# Patient Record
Sex: Male | Born: 1970 | Race: White | Hispanic: No | Marital: Married | Smoking: Never smoker
Health system: Southern US, Community
[De-identification: ages and names within clinical notes are randomized; demographics above are authoritative.]

## PROBLEM LIST (undated history)

## (undated) DIAGNOSIS — I493 Ventricular premature depolarization: Secondary | ICD-10-CM

## (undated) DIAGNOSIS — K760 Fatty (change of) liver, not elsewhere classified: Secondary | ICD-10-CM

## (undated) DIAGNOSIS — N200 Calculus of kidney: Secondary | ICD-10-CM

## (undated) HISTORY — DX: Calculus of kidney: N20.0

## (undated) HISTORY — DX: Fatty (change of) liver, not elsewhere classified: K76.0

## (undated) HISTORY — DX: Ventricular premature depolarization: I49.3

---

## 2002-12-24 HISTORY — PX: HERNIA REPAIR: SHX51

## 2005-12-24 DIAGNOSIS — K760 Fatty (change of) liver, not elsewhere classified: Secondary | ICD-10-CM

## 2005-12-24 HISTORY — DX: Fatty (change of) liver, not elsewhere classified: K76.0

## 2006-12-24 DIAGNOSIS — N200 Calculus of kidney: Secondary | ICD-10-CM

## 2006-12-24 HISTORY — DX: Calculus of kidney: N20.0

## 2011-06-06 ENCOUNTER — Emergency Department (HOSPITAL_COMMUNITY)
Admission: EM | Admit: 2011-06-06 | Discharge: 2011-06-07 | Disposition: A | Discharge status: Home / Self Care | Payer: Medicaid Other | Attending: Emergency Medicine | Admitting: Emergency Medicine

## 2011-06-06 ENCOUNTER — Emergency Department (HOSPITAL_COMMUNITY): Payer: Medicaid Other

## 2011-06-06 DIAGNOSIS — R071 Chest pain on breathing: Secondary | ICD-10-CM | POA: Insufficient documentation

## 2011-06-07 LAB — POCT I-STAT, CHEM 8
Calcium, Ion: 1.19 mmol/L (ref 1.12–1.32)
Creatinine, Ser: 0.9 mg/dL (ref 0.4–1.5)
Glucose, Bld: 131 mg/dL — ABNORMAL HIGH (ref 70–99)
HCT: 44 % (ref 39.0–52.0)
Hemoglobin: 15 g/dL (ref 13.0–17.0)

## 2011-12-25 HISTORY — PX: COLONOSCOPY: SHX174

## 2012-03-11 ENCOUNTER — Ambulatory Visit: Payer: Self-pay | Admitting: Urology

## 2012-05-16 ENCOUNTER — Ambulatory Visit (INDEPENDENT_AMBULATORY_CARE_PROVIDER_SITE_OTHER): Payer: Medicaid Other | Admitting: Family Medicine

## 2012-05-16 VITALS — BP 105/74 | HR 73 | Temp 97.0°F | Ht 68.0 in | Wt 153.0 lb

## 2012-05-16 DIAGNOSIS — K760 Fatty (change of) liver, not elsewhere classified: Secondary | ICD-10-CM

## 2012-05-16 DIAGNOSIS — M549 Dorsalgia, unspecified: Secondary | ICD-10-CM

## 2012-05-16 DIAGNOSIS — L708 Other acne: Secondary | ICD-10-CM

## 2012-05-16 DIAGNOSIS — L709 Acne, unspecified: Secondary | ICD-10-CM

## 2012-05-16 DIAGNOSIS — E8881 Metabolic syndrome: Secondary | ICD-10-CM

## 2012-05-16 DIAGNOSIS — K7689 Other specified diseases of liver: Secondary | ICD-10-CM

## 2012-05-16 NOTE — Patient Instructions (Signed)
It was great to see you today! I will look into any monitoring that we need to do for your fatty liver and get in touch on that.

## 2012-06-02 ENCOUNTER — Encounter: Payer: Self-pay | Admitting: Family Medicine

## 2012-06-02 DIAGNOSIS — E8881 Metabolic syndrome: Secondary | ICD-10-CM | POA: Insufficient documentation

## 2012-06-02 DIAGNOSIS — L709 Acne, unspecified: Secondary | ICD-10-CM | POA: Insufficient documentation

## 2012-06-02 DIAGNOSIS — M549 Dorsalgia, unspecified: Secondary | ICD-10-CM | POA: Insufficient documentation

## 2012-06-02 DIAGNOSIS — K760 Fatty (change of) liver, not elsewhere classified: Secondary | ICD-10-CM | POA: Insufficient documentation

## 2012-06-02 NOTE — Assessment & Plan Note (Signed)
Will send referral as requested. 

## 2012-06-02 NOTE — Assessment & Plan Note (Signed)
Will send referral as requested.  No abnormalities on exam today.

## 2012-06-02 NOTE — Assessment & Plan Note (Signed)
Will send referral as requested.

## 2012-06-02 NOTE — Progress Notes (Signed)
Patient ID: Robert Moon, male   DOB: [birth year], 41 y.o.   MRN: [id number] Subjective: The patient is a 41 y.o. year old male who presents today for new patient appointment.  Patient reports minimal medical problems.  He sees Dr. Josefina Do, chiropractor, for various problems with back pain, Dr. Armida Sans, dermatology, for acne and viral warts, and Dr. Becky Augusta, functional medicine, for various endocrine disturbances (adrenal fatigue, hyperlipidemia, insulin resistance, functional hypothyroid).  He is currently on no medications on a daily basis.  He reports that fatty liver infiltrate was discovered on abdominal CT about 6 years ago.  No obvious etiology was found.  He does not drink significantly, in not overweight, and does not use illicit substances.  As far as he knows, he has not had exposure to any form of hepatitis.  He has occasionally had mildly elevated liver enzymes but otherwise labs have been normal.  Review of the patient's outside labs shows cholesterol levels are within normal limits, CBC, CMET, TSH, A1c are all within normal limits.  Lab results will be scanned to patient chart.  Patient does report some mild acne for which he take adapalene on a PRN basis.  Otherwise denies any significant weight changes, headaches, visual changes, n/v/d, SOB, CP, DOE, leg swelling, rashes, abdominal pain or swelling.  Patient's past medical, social, and family history were reviewed and updated as appropriate. History  Substance Use Topics  . Smoking status: Never Smoker   . Smokeless tobacco: Never Used  . Alcohol Use: No   Objective:  Filed Vitals:   05/16/12 1457  BP: 105/74  Pulse: 73  Temp: 97 F (36.1 C)   Gen: NAD, appropriate weight HEENT: MMM, EOMI, TM clear bilaterally, pharynx non-erythematous CV: RRR, no murmurs Resp: CTABL Abd: SNTND, no hepatomegaly Ext: No edema, 2+ pulses  Assessment/Plan:  Please also see individual problems in problem list for  problem-specific plans.

## 2012-06-02 NOTE — Assessment & Plan Note (Signed)
No obvious source.  Currently asymptomatic. Patient is doing everything that he can to correct this condition.  Review of labs today don't show significant abnormalities.  Will continue surveillance and will give patient list of labs he needs to make certain he has had.  Patient can let me know if he needs any of them and we can draw.

## 2012-07-08 ENCOUNTER — Telehealth: Payer: Self-pay | Admitting: *Deleted

## 2012-07-08 NOTE — Telephone Encounter (Signed)
NPI # provided to authorize requested visits.  Gaylene Brooks, RN

## 2012-07-08 NOTE — Telephone Encounter (Signed)
Returned call to [place] and left message to call our office back.  Gaylene Brooks, RN

## 2012-07-08 NOTE — Telephone Encounter (Signed)
They have had it.  I sent in the referral shortly after seeing him for his new patient appointment on 5/24.  But if they need the NPI again, they can have it.

## 2012-07-08 NOTE — Telephone Encounter (Signed)
Sandy at [facility] calling to obtain NPI # to authorize acne treatment.  Patient had an appt on 06/19/12 and has upcoming appt on 07/17/12.  Patient's medical records were faxed to our office for Dr. Louanne Belton to review in 05/2012.  They have not heard back from our office with NPI # authorizing treatments.  They are requesting a retroactive authorization for 06/19/12 and additional follow-up appts.  Will route note to Dr. Louanne Belton and call their office back.  Gaylene Brooks, RN

## 2012-07-17 ENCOUNTER — Other Ambulatory Visit: Payer: Self-pay | Admitting: Family Medicine

## 2012-07-17 DIAGNOSIS — K760 Fatty (change of) liver, not elsewhere classified: Secondary | ICD-10-CM

## 2012-07-22 ENCOUNTER — Ambulatory Visit: Payer: Medicaid Other | Admitting: Family Medicine

## 2012-08-06 ENCOUNTER — Other Ambulatory Visit: Payer: Self-pay | Admitting: Family Medicine

## 2012-08-06 DIAGNOSIS — K760 Fatty (change of) liver, not elsewhere classified: Secondary | ICD-10-CM

## 2012-08-06 DIAGNOSIS — IMO0002 Reserved for concepts with insufficient information to code with codable children: Secondary | ICD-10-CM

## 2012-08-06 DIAGNOSIS — E8881 Metabolic syndrome: Secondary | ICD-10-CM

## 2012-08-18 ENCOUNTER — Ambulatory Visit (INDEPENDENT_AMBULATORY_CARE_PROVIDER_SITE_OTHER): Payer: Medicaid Other | Admitting: Cardiovascular Disease

## 2012-08-18 ENCOUNTER — Encounter: Payer: Self-pay | Admitting: Cardiovascular Disease

## 2012-08-18 VITALS — BP 120/80 | HR 83 | Ht 68.0 in | Wt 152.5 lb

## 2012-08-18 DIAGNOSIS — R0600 Dyspnea, unspecified: Secondary | ICD-10-CM

## 2012-08-18 DIAGNOSIS — R0989 Other specified symptoms and signs involving the circulatory and respiratory systems: Secondary | ICD-10-CM

## 2012-08-18 DIAGNOSIS — Z01810 Encounter for preprocedural cardiovascular examination: Secondary | ICD-10-CM

## 2012-08-18 NOTE — Patient Instructions (Addendum)
Your physician has requested that you have an exercise tolerance test. For further information please visit [email or web address]. Please also follow instruction sheet, as given.  Follow up as needed.  

## 2012-08-19 ENCOUNTER — Encounter: Payer: Self-pay | Admitting: Cardiovascular Disease

## 2012-08-19 DIAGNOSIS — R0600 Dyspnea, unspecified: Secondary | ICD-10-CM | POA: Insufficient documentation

## 2012-08-19 NOTE — Assessment & Plan Note (Addendum)
The patient wants to start an exercise program. Overall he is asymptomatic except for mild exertional dyspnea. His physical exam is within normal limits and baseline ECG is normal. I will obtain a treadmill stress test for evaluation but there does not appear to be any contraindication for starting an exercise program. Given that his cardiac exam is unremarkable, I will not obtain an echocardiogram at this time and has his GXT is abnormal.

## 2012-08-19 NOTE — Progress Notes (Signed)
HPI  This is a 41 year old male who is here today for evaluation of exercise capacity before starting an exercise program. He reports mild exertional dyspnea without chest pain. There is no orthopnea, PND or lower extremity edema. He is not aware of any previous cardiac history and has no risk factors for coronary artery disease. He was diagnosed with fatty liver years ago. For that reason he wants to start an exercise program. He is not obese.  No Known Allergies   Current Outpatient Prescriptions on File Prior to Visit  Medication Sig Dispense Refill  . Adapalene 0.3 % gel Apply topically at bedtime.         Past Medical History  Diagnosis Date  . Renal stone 2008  . Fatty infiltration of liver 2007    No obvious etiology     Past Surgical History  Procedure Date  . Hernia repair 2004    inguinal  . Colonoscopy 2013     Family History  Problem Relation Age of Onset  . Alcohol abuse Paternal Grandfather   . Prostate cancer Paternal Grandfather 74  . Heart failure Paternal Grandfather 89  . Diabetes type II Paternal Grandfather   . Allergies Maternal Grandmother   . Atrial fibrillation Maternal Grandmother 85  . Hypertension Maternal Grandmother   . Stroke Maternal Grandmother 48  . Liver cancer Mother 36    Primary hepatoma  . Depression Mother   . Adrenal disorder Sister 73    adrenocortical carcinoma  . Heart failure Maternal Grandfather 86  . Diabetes type II Paternal Grandmother   . Hypertension Paternal Grandmother   . Kidney failure Paternal Grandmother   . ALS Other 35    maternal great grandmother     History   Social History  . Marital Status: Married    Spouse Name: N/A    Number of Children: N/A  . Years of Education: N/A   Occupational History  . Not on file.   Social History Main Topics  . Smoking status: Never Smoker   . Smokeless tobacco: Never Used  . Alcohol Use: No  . Drug Use: No  . Sexually Active: Yes -- Male partner(s)    Other Topics Concern  . Not on file   Social History Narrative   Lives with wife, two sons and two daughters.  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     ROS Constitutional: Negative for fever, chills, diaphoresis, activity change, appetite change and fatigue.  HENT: Negative for hearing loss, nosebleeds, congestion, sore throat, facial swelling, drooling, trouble swallowing, neck pain, voice change, sinus pressure and tinnitus.  Eyes: Negative for photophobia, pain, discharge and visual disturbance.  Respiratory: Negative for apnea, cough, chest tightness and wheezing.  Cardiovascular: Negative for chest pain, palpitations and leg swelling.  Gastrointestinal: Negative for nausea, vomiting, abdominal pain, diarrhea, constipation, blood in stool and abdominal distention.  Genitourinary: Negative for dysuria, urgency, frequency, hematuria and decreased urine volume.  Musculoskeletal: Negative for myalgias, back pain, joint swelling, arthralgias and gait problem.  Skin: Negative for color change, pallor, rash and wound.  Neurological: Negative for dizziness, tremors, seizures, syncope, speech difficulty, weakness, light-headedness, numbness and headaches.  Psychiatric/Behavioral: Negative for suicidal ideas, hallucinations, behavioral problems and agitation. The patient is not nervous/anxious.     PHYSICAL EXAM   BP 120/80  Pulse 83  Ht 5\' 8"  (1.727 m)  Wt 152 lb 8 oz (69.174 kg)  BMI 23.19 kg/m2 Constitutional: He is oriented to person, place, and time.  He appears well-developed and well-nourished. No distress.  HENT: No nasal discharge.  Head: Normocephalic and atraumatic.  Eyes: Pupils are equal and round. Right eye exhibits no discharge. Left eye exhibits no discharge.  Neck: Normal range of motion. Neck supple. No JVD present. No thyromegaly present.  Cardiovascular: Normal rate, regular rhythm, normal heart sounds and. Exam reveals no gallop and no friction rub. No  murmur heard.  Pulmonary/Chest: Effort normal and breath sounds normal. No stridor. No respiratory distress. He has no wheezes. He has no rales. He exhibits no tenderness.  Abdominal: Soft. Bowel sounds are normal. He exhibits no distension. There is no tenderness. There is no rebound and no guarding.  Musculoskeletal: Normal range of motion. He exhibits no edema and no tenderness.  Neurological: He is alert and oriented to person, place, and time. Coordination normal.  Skin: Skin is warm and dry. No rash noted. He is not diaphoretic. No erythema. No pallor.  Psychiatric: He has a normal mood and affect. His behavior is normal. Judgment and thought content normal.       EKG: Sinus  Rhythm  WITHIN NORMAL LIMITS   ASSESSMENT AND PLAN

## 2012-08-21 ENCOUNTER — Encounter: Payer: Self-pay | Admitting: *Deleted

## 2012-08-26 ENCOUNTER — Ambulatory Visit (INDEPENDENT_AMBULATORY_CARE_PROVIDER_SITE_OTHER): Payer: Medicaid Other | Admitting: Cardiovascular Disease

## 2012-08-26 ENCOUNTER — Encounter: Payer: Self-pay | Admitting: Cardiovascular Disease

## 2012-08-26 VITALS — BP 108/68 | HR 107 | Ht 68.0 in | Wt 154.0 lb

## 2012-08-26 DIAGNOSIS — R0600 Dyspnea, unspecified: Secondary | ICD-10-CM

## 2012-08-26 DIAGNOSIS — R0609 Other forms of dyspnea: Secondary | ICD-10-CM

## 2012-08-26 NOTE — Procedures (Signed)
    Treadmill Stress test  Indication: Dyspnea and exercise tolerance estimation.  Baseline Data:  Resting EKG shows NSR with rate of 107 bpm, no significant ST changes. Resting blood pressure of 108/68 mm Hg Stand bruce protocal was used.  Exercise Data:  Patient exercised for 12 min 0 sec,  Peak heart rate of 167 bpm.  This was 92 % of the maximum predicted heart rate. No symptoms of chest pain or lightheadedness were reported at peak stress or in recovery.  Peak Blood pressure recorded was 154/72 Maximal work level: 12.9 METs.  Heart rate at 3 minutes in recovery was 115 bpm. BP response: Normal HR response: Normal  EKG with Exercise: Sinus tachycardia with no significant ST changes  FINAL IMPRESSION: Normal exercise stress test. No significant EKG changes concerning for ischemia. Good exercise tolerance.  Recommendation: Indirect estimation of peak VO2 is 42.46 which is considered good for age.

## 2012-08-26 NOTE — Patient Instructions (Addendum)
Your stress test is normal.  Exercise capacity is good 13 METS.

## 2012-08-27 ENCOUNTER — Telehealth: Payer: Self-pay

## 2012-08-27 NOTE — Telephone Encounter (Signed)
Pt informed Understanding verb 

## 2012-08-27 NOTE — Telephone Encounter (Signed)
Message copied by Marcelle Overlie on Wed Aug 27, 2012  7:41 AM ------      Message from: Lorine Bears A      Created: Tue Aug 26, 2012  6:48 PM      Regarding: Stress test       Please inform him that his estimated peak VO2 was 42.46 which is considered good for his age.

## 2012-08-27 NOTE — Telephone Encounter (Signed)
No answer

## 2012-09-17 ENCOUNTER — Telehealth: Payer: Self-pay | Admitting: Family Medicine

## 2012-09-17 DIAGNOSIS — K760 Fatty (change of) liver, not elsewhere classified: Secondary | ICD-10-CM

## 2012-09-17 NOTE — Telephone Encounter (Signed)
Patient is calling to speak to Dr. Louanne Belton about Robert Moon and Glucose Tolerance Test.  His Insurance will cover it, procedure codes are 47829 and [phone].  Tests just need to be ordered.

## 2012-09-17 NOTE — Telephone Encounter (Signed)
Forwarded to pcp.Sole Lengacher Lynetta  

## 2012-09-18 NOTE — Telephone Encounter (Signed)
Please schedule, thanks!  Robert Moon

## 2012-09-19 ENCOUNTER — Telehealth: Payer: Self-pay | Admitting: *Deleted

## 2012-09-19 NOTE — Telephone Encounter (Signed)
Telephone  Call to pt to make appt for lab

## 2012-09-22 ENCOUNTER — Other Ambulatory Visit: Payer: Medicaid Other

## 2012-09-22 DIAGNOSIS — K760 Fatty (change of) liver, not elsewhere classified: Secondary | ICD-10-CM

## 2012-09-22 LAB — IRON AND TIBC
%SAT: 27 % (ref 20–55)
Iron: 95 ug/dL (ref 42–165)
UIBC: 260 ug/dL (ref 125–400)

## 2012-09-22 NOTE — Progress Notes (Unsigned)
LABS DRAWN TODAY PER DR. [facility] Ceceilia Cephus

## 2012-09-23 LAB — HEPATITIS PANEL, ACUTE
Hep B C IgM: NEGATIVE
Hepatitis B Surface Ag: NEGATIVE

## 2012-09-23 LAB — GLUCOSE TOLERANCE, 3 HOURS
Glucose Tolerance, 1 hour: 122 mg/dL (ref 70–189)
Glucose Tolerance, 2 hour: 102 mg/dL (ref 70–164)
Glucose Tolerance, Fasting: 93 mg/dL (ref 70–104)

## 2012-09-25 LAB — ANTI-MICROSOMAL ANTIBODY LIVER / KIDNEY: LKM1 Ab: <=20 U (ref <=20.0)

## 2012-10-03 ENCOUNTER — Encounter: Payer: Self-pay | Admitting: Family Medicine

## 2012-10-16 ENCOUNTER — Telehealth: Payer: Self-pay | Admitting: Family Medicine

## 2012-10-16 DIAGNOSIS — K760 Fatty (change of) liver, not elsewhere classified: Secondary | ICD-10-CM

## 2012-10-16 DIAGNOSIS — E88819 Insulin resistance, unspecified: Secondary | ICD-10-CM

## 2012-10-16 DIAGNOSIS — E8881 Metabolic syndrome: Secondary | ICD-10-CM

## 2012-10-16 NOTE — Telephone Encounter (Signed)
Patient is calling about the blood work that he dropped off copies of this morning and he is concerned about his CO2 being elevated for 4 months.  He is requesting additional blood work to test his Aldosterone ratio and Renin, which he will need orders for.  Also, he is concerned that his Blood Pressure is running low.

## 2012-10-21 NOTE — Telephone Encounter (Signed)
Patient is calling back to find out the status of the orders for the labs he requested.

## 2012-10-27 NOTE — Telephone Encounter (Signed)
Talked with patient.  Explained that further testing is not in order given the elevation is minimal and accompanied with no other derangements.  Have agreed to monitor serial BMETs every 3-4 months.

## 2012-10-31 NOTE — Telephone Encounter (Signed)
Pt called back to say he went to [facility] this week and the following are the readings from that: Potassium - 5.3 CO2 - 30 Urine PH - 6.5

## 2012-10-31 NOTE — Telephone Encounter (Signed)
Please let the patient know that I would like the lab repeated in our lab in about 1 month.

## 2012-11-04 ENCOUNTER — Telehealth: Payer: Self-pay | Admitting: Family Medicine

## 2012-11-04 NOTE — Telephone Encounter (Signed)
Will forward to Dr Ritch 

## 2012-11-04 NOTE — Telephone Encounter (Signed)
Patient would like to speak to Dr. Louanne Belton about his labs from early November about his Potassium level.

## 2012-11-04 NOTE — Telephone Encounter (Signed)
Called patient and got no answer just ringing

## 2012-11-06 NOTE — Telephone Encounter (Signed)
Reporting that most recent labs had K of 4 and CO2 of 28.

## 2012-11-26 ENCOUNTER — Other Ambulatory Visit: Payer: Medicaid Other

## 2012-11-26 DIAGNOSIS — K760 Fatty (change of) liver, not elsewhere classified: Secondary | ICD-10-CM

## 2012-11-26 LAB — BASIC METABOLIC PANEL
CO2: 29 mEq/L (ref 19–32)
Calcium: 9.6 mg/dL (ref 8.4–10.5)
Glucose, Bld: 87 mg/dL (ref 70–99)
Potassium: 4.5 mEq/L (ref 3.5–5.3)
Sodium: 142 mEq/L (ref 135–145)

## 2012-11-26 NOTE — Progress Notes (Signed)
BMP DONE TODAY Shavana Calder 

## 2012-12-01 ENCOUNTER — Encounter: Payer: Self-pay | Admitting: Family Medicine

## 2012-12-11 ENCOUNTER — Telehealth: Payer: Self-pay | Admitting: Family Medicine

## 2012-12-11 NOTE — Telephone Encounter (Signed)
Pt is asking about his referrals - has some questions

## 2012-12-12 ENCOUNTER — Telehealth: Payer: Self-pay | Admitting: Family Medicine

## 2012-12-12 NOTE — Telephone Encounter (Signed)
Pt is asking what he needs to do to sign up for the MyChart

## 2012-12-12 NOTE — Telephone Encounter (Signed)
Dermatology called for authorization for [name], but the referral had expired.  Then [name] called to request a new referral and inquired about other referrals that he had for himself and his entire family.  He was told the start and expirations dates and the number of visits that were authorized and that for further referrals he would need to make an appt to see Dr. Louanne Belton because he has only been seen one time and that was in May 2013.  He did not wish to schedule an appt at that time.  Then he called back yesterday asking for the same information and a message was forwarded to Dr. Louanne Belton.  He has now called this morning asking for the very same and again has not scheduled an appt.  After his initial call, I spoke to Dr. Louanne Belton to make him aware of this situation.

## 2012-12-15 ENCOUNTER — Encounter: Payer: Self-pay | Admitting: Family Medicine

## 2013-01-01 ENCOUNTER — Telehealth: Payer: Self-pay | Admitting: Family Medicine

## 2013-01-01 NOTE — Telephone Encounter (Signed)
Pt dropped off papers for Dr. Louanne Belton to make an referral. TW

## 2013-01-02 ENCOUNTER — Ambulatory Visit (INDEPENDENT_AMBULATORY_CARE_PROVIDER_SITE_OTHER): Payer: Medicaid Other | Admitting: Family Medicine

## 2013-01-02 ENCOUNTER — Encounter: Payer: Self-pay | Admitting: Family Medicine

## 2013-01-02 VITALS — BP 111/75 | HR 87 | Ht 68.0 in | Wt 155.5 lb

## 2013-01-02 DIAGNOSIS — H9319 Tinnitus, unspecified ear: Secondary | ICD-10-CM

## 2013-01-02 DIAGNOSIS — H9312 Tinnitus, left ear: Secondary | ICD-10-CM

## 2013-01-02 DIAGNOSIS — Z872 Personal history of diseases of the skin and subcutaneous tissue: Secondary | ICD-10-CM

## 2013-01-02 DIAGNOSIS — Z86018 Personal history of other benign neoplasm: Secondary | ICD-10-CM

## 2013-01-02 DIAGNOSIS — K7689 Other specified diseases of liver: Secondary | ICD-10-CM

## 2013-01-02 DIAGNOSIS — K760 Fatty (change of) liver, not elsewhere classified: Secondary | ICD-10-CM

## 2013-01-02 DIAGNOSIS — E8881 Metabolic syndrome: Secondary | ICD-10-CM

## 2013-01-02 DIAGNOSIS — B079 Viral wart, unspecified: Secondary | ICD-10-CM

## 2013-01-05 DIAGNOSIS — H9312 Tinnitus, left ear: Secondary | ICD-10-CM | POA: Insufficient documentation

## 2013-01-05 DIAGNOSIS — Z86018 Personal history of other benign neoplasm: Secondary | ICD-10-CM | POA: Insufficient documentation

## 2013-01-05 NOTE — Assessment & Plan Note (Signed)
Present for 4 months with no obvious precipitating factor.  If present for total of 6 months referral to ENT would be appropriate.  Will continue watchful waiting for now.

## 2013-01-05 NOTE — Progress Notes (Signed)
Patient ID: Robert Moon, male   DOB: Sep 19, 1971, 42 y.o.   MRN: [id number] Subjective: The patient is a 42 y.o. year old male who presents today for discussion of referrals.  1. Dermatology: patient has had dysplastic nevus removed and goes to dermatology for yearly total body exams.  Has also had some problems with viral warts on his hands that have been frozen off.  2. Functional medicine: Patient sees Dr. Aggie Moats for dietary changes and monitoring of his fatty liver disease and a diagnosis she has given him of insulin resistance.  3. Chiropractor: Sees chiropractor every 1-2 months for back adjustments.  4. ENT: Patient has been having some problems with tinitus in left ear for about the last 4 months.  No dizziness/loss of balance.  No nasal congestion.  Has not had a problem with this before.  Would like to see ENT for this.  Patient's past medical, social, and family history were reviewed and updated as appropriate. History  Substance Use Topics  . Smoking status: Never Smoker   . Smokeless tobacco: Never Used  . Alcohol Use: No   Objective:  Filed Vitals:   01/02/13 0948  BP: 111/75  Pulse: 87   Hand: Small (3mm) bump on left hand between thumb and second digit.  Procedure note: Consent obtained and risks/benefits discussed.  Area was frozen using 3 freeze-thaw cycles of 20-30sec each.  Pt tolerated well.  Assessment/Plan: Chiropractor: Referral not appropriate or allowed by medicaid.  Please also see individual problems in problem list for problem-specific plans.

## 2013-01-05 NOTE — Assessment & Plan Note (Signed)
Referral place per patient request

## 2013-01-28 ENCOUNTER — Encounter: Payer: Self-pay | Admitting: Family Medicine

## 2013-01-29 ENCOUNTER — Other Ambulatory Visit: Payer: Self-pay | Admitting: Family Medicine

## 2013-01-29 ENCOUNTER — Telehealth: Payer: Self-pay | Admitting: Family Medicine

## 2013-01-29 DIAGNOSIS — H4010X4 Unspecified open-angle glaucoma, indeterminate stage: Secondary | ICD-10-CM | POA: Insufficient documentation

## 2013-01-29 NOTE — Telephone Encounter (Signed)
Patient just called to see if Robert Moon received msg via MyChart yesterday.

## 2013-02-07 ENCOUNTER — Other Ambulatory Visit: Payer: Self-pay

## 2013-03-18 ENCOUNTER — Encounter: Payer: Self-pay | Admitting: Family Medicine

## 2013-03-19 ENCOUNTER — Encounter: Payer: Self-pay | Admitting: Family Medicine

## 2013-04-07 ENCOUNTER — Encounter: Payer: Self-pay | Admitting: Family Medicine

## 2013-04-07 ENCOUNTER — Ambulatory Visit (INDEPENDENT_AMBULATORY_CARE_PROVIDER_SITE_OTHER): Payer: Medicaid Other | Admitting: Family Medicine

## 2013-04-07 VITALS — BP 126/76 | HR 76 | Temp 98.2°F | Ht 68.0 in | Wt 144.8 lb

## 2013-04-07 DIAGNOSIS — M25562 Pain in left knee: Secondary | ICD-10-CM

## 2013-04-07 DIAGNOSIS — H9319 Tinnitus, unspecified ear: Secondary | ICD-10-CM

## 2013-04-07 DIAGNOSIS — S134XXD Sprain of ligaments of cervical spine, subsequent encounter: Secondary | ICD-10-CM

## 2013-04-07 DIAGNOSIS — H9312 Tinnitus, left ear: Secondary | ICD-10-CM

## 2013-04-07 DIAGNOSIS — M25569 Pain in unspecified knee: Secondary | ICD-10-CM

## 2013-04-07 DIAGNOSIS — Z5189 Encounter for other specified aftercare: Secondary | ICD-10-CM

## 2013-04-07 MED ORDER — CYCLOBENZAPRINE HCL 10 MG PO TABS: 10.0000 mg | ORAL_TABLET | Freq: Every day | ORAL | Status: DC | PRN

## 2013-04-07 MED ORDER — CYCLOBENZAPRINE HCL 10 MG PO TABS: 10.0000 mg | ORAL_TABLET | Freq: Once | ORAL | Status: DC

## 2013-04-07 NOTE — Patient Instructions (Signed)
It was good to see you today! I would recommend that you use 200-400mg  of motrin 3 times per day for then next week and a half.  Use some compression when you are exercising and ice afterwards. We will get you in to see the ENT for your ear. We will also get you in to see the physical therapist.

## 2013-04-13 ENCOUNTER — Encounter: Payer: Self-pay | Admitting: *Deleted

## 2013-04-13 DIAGNOSIS — M25562 Pain in left knee: Secondary | ICD-10-CM | POA: Insufficient documentation

## 2013-04-13 DIAGNOSIS — S134XXA Sprain of ligaments of cervical spine, initial encounter: Secondary | ICD-10-CM | POA: Insufficient documentation
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
## 2013-04-13 NOTE — Assessment & Plan Note (Signed)
?  patellofemoral syndrome.  Ice/compression, avoiding exacerbating factors.  Rec strengthening of medial muscle groups.

## 2013-04-13 NOTE — Assessment & Plan Note (Signed)
Ref to ENT as previously agreed.  Doubt serious pathology at this time.

## 2013-04-13 NOTE — Assessment & Plan Note (Signed)
Send to PT for further help to avoid long-term neck pain.  Gave trial of flexeril.

## 2013-04-14 ENCOUNTER — Ambulatory Visit: Payer: Medicaid Other | Attending: Family Medicine | Admitting: Physical Therapy

## 2013-04-14 ENCOUNTER — Ambulatory Visit: Payer: Medicaid Other | Admitting: Physical Therapy

## 2013-04-14 DIAGNOSIS — Z5189 Encounter for other specified aftercare: Secondary | ICD-10-CM | POA: Insufficient documentation

## 2013-04-14 DIAGNOSIS — IMO0001 Reserved for inherently not codable concepts without codable children: Secondary | ICD-10-CM | POA: Insufficient documentation

## 2013-04-14 DIAGNOSIS — M2550 Pain in unspecified joint: Secondary | ICD-10-CM | POA: Insufficient documentation

## 2013-04-14 DIAGNOSIS — R293 Abnormal posture: Secondary | ICD-10-CM | POA: Insufficient documentation

## 2013-04-16 ENCOUNTER — Ambulatory Visit: Payer: Medicaid Other | Admitting: Physical Therapy

## 2013-04-22 ENCOUNTER — Ambulatory Visit: Payer: Medicaid Other | Admitting: Physical Therapy

## 2013-04-27 ENCOUNTER — Encounter: Payer: Medicaid Other | Admitting: Rehabilitation

## 2013-04-28 ENCOUNTER — Ambulatory Visit: Payer: Medicaid Other | Attending: Family Medicine | Admitting: Physical Therapy

## 2013-04-28 ENCOUNTER — Encounter: Payer: Self-pay | Admitting: Family Medicine

## 2013-04-28 ENCOUNTER — Telehealth: Payer: Self-pay | Admitting: *Deleted

## 2013-04-28 DIAGNOSIS — Z5189 Encounter for other specified aftercare: Secondary | ICD-10-CM | POA: Insufficient documentation

## 2013-04-28 DIAGNOSIS — R293 Abnormal posture: Secondary | ICD-10-CM | POA: Insufficient documentation

## 2013-04-28 DIAGNOSIS — IMO0001 Reserved for inherently not codable concepts without codable children: Secondary | ICD-10-CM | POA: Insufficient documentation

## 2013-04-28 DIAGNOSIS — M2550 Pain in unspecified joint: Secondary | ICD-10-CM | POA: Insufficient documentation

## 2013-04-28 NOTE — Telephone Encounter (Signed)
Will forward to pcp for review.

## 2013-04-30 ENCOUNTER — Encounter: Payer: Medicaid Other | Admitting: Rehabilitation

## 2013-05-06 ENCOUNTER — Ambulatory Visit: Payer: Medicaid Other | Admitting: Physical Therapy

## 2013-05-19 ENCOUNTER — Encounter: Payer: Self-pay | Admitting: Family Medicine

## 2013-05-19 DIAGNOSIS — K760 Fatty (change of) liver, not elsewhere classified: Secondary | ICD-10-CM

## 2013-05-28 ENCOUNTER — Telehealth: Payer: Self-pay | Admitting: Family Medicine

## 2013-05-28 NOTE — Telephone Encounter (Signed)
Dr. Aurora Mask is call to let Dr. Louanne Belton know that they are not able to take this referral due to not having On Call services, hospital rights and he feels this patient would be better suited with a larger GI practice.  Jimmy Footman asked me to forward straight to Dr. Louanne Belton.

## 2013-05-28 NOTE — Telephone Encounter (Signed)
Please contact Robert Moon and let him know the Dr. Kinnie Scales feels he would be better served at a larger Gi practice and does not feel that his practice would be able meet Robert Moon's needs.  If Robert Moon has a different Gi physician that he would like to see, we would be happy to fax the referral to them.  We would also be happy to send the referral to the [facility] physicians who we normally refer to.

## 2013-05-29 NOTE — Telephone Encounter (Signed)
Patient was given the message below and wants to call Dr. Aurora Mask office himself.

## 2013-05-29 NOTE — Telephone Encounter (Signed)
Patient called back and was able to get an appt with Dr. Kinnie Scales.  The appt is on 7/2 at 10:00.

## 2013-06-01 ENCOUNTER — Telehealth: Payer: Self-pay | Admitting: Family Medicine

## 2013-06-01 ENCOUNTER — Encounter: Payer: Medicaid Other | Admitting: Physical Therapy

## 2013-06-01 ENCOUNTER — Ambulatory Visit: Payer: Medicaid Other | Attending: Family Medicine | Admitting: Rehabilitation

## 2013-06-01 ENCOUNTER — Ambulatory Visit: Payer: Medicaid Other | Admitting: Physical Therapy

## 2013-06-01 DIAGNOSIS — M2550 Pain in unspecified joint: Secondary | ICD-10-CM | POA: Insufficient documentation

## 2013-06-01 DIAGNOSIS — IMO0001 Reserved for inherently not codable concepts without codable children: Secondary | ICD-10-CM | POA: Insufficient documentation

## 2013-06-01 DIAGNOSIS — R293 Abnormal posture: Secondary | ICD-10-CM | POA: Insufficient documentation

## 2013-06-01 DIAGNOSIS — Z5189 Encounter for other specified aftercare: Secondary | ICD-10-CM | POA: Insufficient documentation

## 2013-06-01 NOTE — Telephone Encounter (Signed)
Will forward to Lupita Leash, CMA for referral.  Radene Ou, CMA

## 2013-06-01 NOTE — Telephone Encounter (Signed)
Patient was able to call and get the appointment with Dr. Aurora Mask office himself after originally being turned down.  Dr. Aurora Mask office now needs someone from [facility] to go through [facility] to get the authorization number?

## 2013-06-01 NOTE — Telephone Encounter (Signed)
Called Dr. Jennye Boroughs office.  Informed them that referral authorization thru [place] [facility] has been put on hold indefinitely and that all they need from [place] is our NPI number.  NPI authorization given for Mr. Yarbough's appointment scheduled for 06/24/2013 @ 10:20am.  Ileana Ladd

## 2013-06-03 ENCOUNTER — Encounter: Payer: Medicaid Other | Admitting: Physical Therapy

## 2013-06-09 ENCOUNTER — Ambulatory Visit: Payer: Medicaid Other | Admitting: Physical Therapy

## 2013-06-11 ENCOUNTER — Encounter: Payer: Medicaid Other | Admitting: Physical Therapy

## 2013-06-17 ENCOUNTER — Telehealth: Payer: Self-pay | Admitting: Family Medicine

## 2013-06-17 NOTE — Telephone Encounter (Signed)
This call should have been directed to Medical Records.  Someone faxed pt's medicaid card today and she could not read it.  I looked up picture we have of card and gave what information needed.  Duey Liller, Darlyne Russian, CMA

## 2013-06-17 NOTE — Telephone Encounter (Signed)
Amy calling from [facility] to get information about insurance. Received fax from [facility] but she can not read this and needs information since Juventino has an appointment this afternoon.

## 2013-08-05 ENCOUNTER — Other Ambulatory Visit: Payer: Self-pay | Admitting: Gastroenterology

## 2013-08-05 DIAGNOSIS — K760 Fatty (change of) liver, not elsewhere classified: Secondary | ICD-10-CM

## 2013-08-10 ENCOUNTER — Ambulatory Visit
Admission: RE | Admit: 2013-08-10 | Discharge: 2013-08-10 | Disposition: A | Discharge status: Home / Self Care | Payer: Medicaid Other | Source: Ambulatory Visit | Attending: Gastroenterology | Admitting: Gastroenterology

## 2013-08-10 DIAGNOSIS — K760 Fatty (change of) liver, not elsewhere classified: Secondary | ICD-10-CM

## 2013-10-29 ENCOUNTER — Other Ambulatory Visit: Payer: Self-pay

## 2014-06-10 IMAGING — US US ABDOMEN COMPLETE
1 series · 14 of 25 positions shown · non-contrast
Comparison: None.

CLINICAL DATA: Fatty liver.

COMPLETE ABDOMINAL ULTRASOUND

[Series 1: us abdomen complete · 0.35mm/px · 14 of 122 slices shown]
[im 1/122]
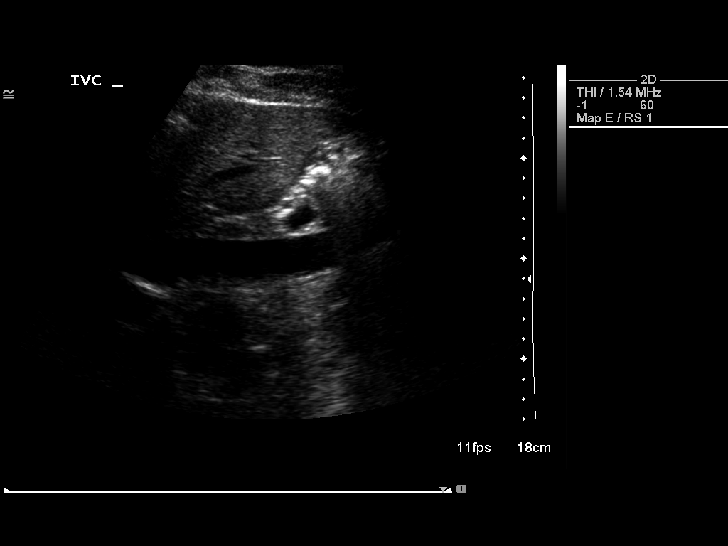
[im 11/122]
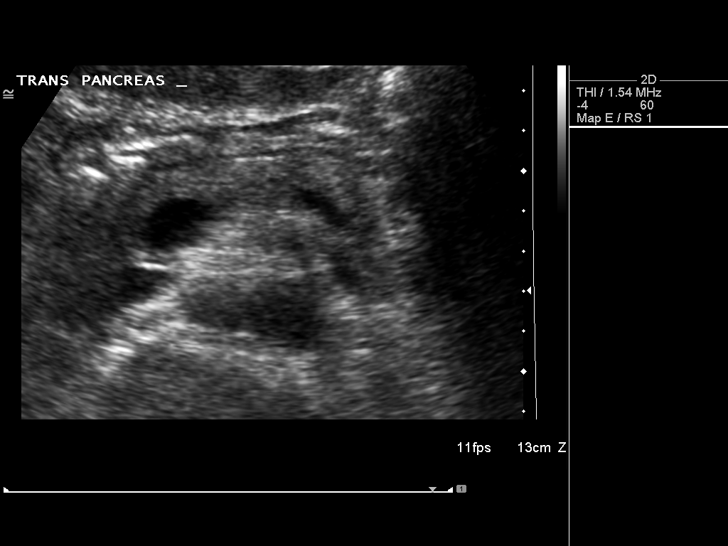
[im 21/122]
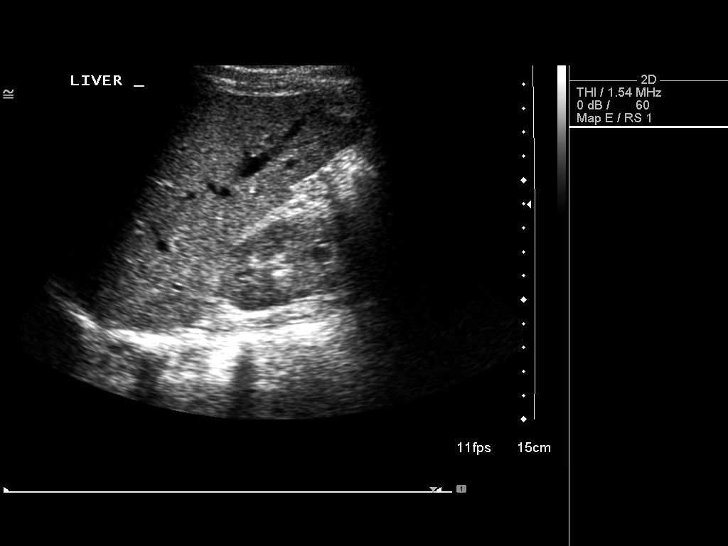
[im 31/122]
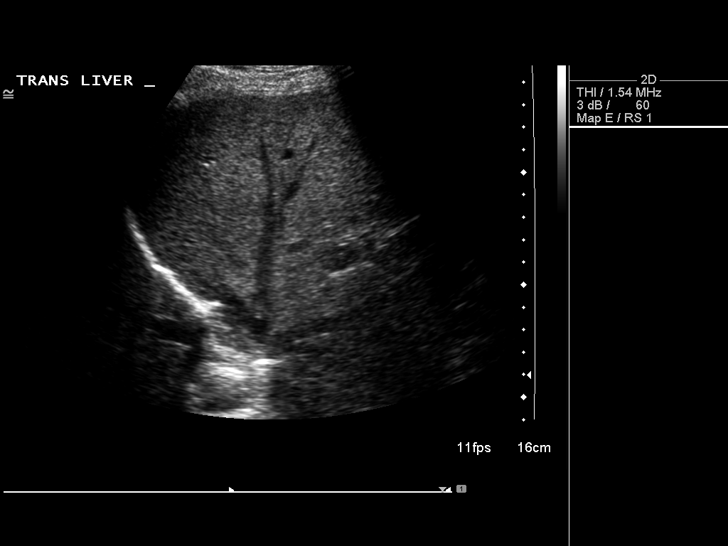
[im 41/122]
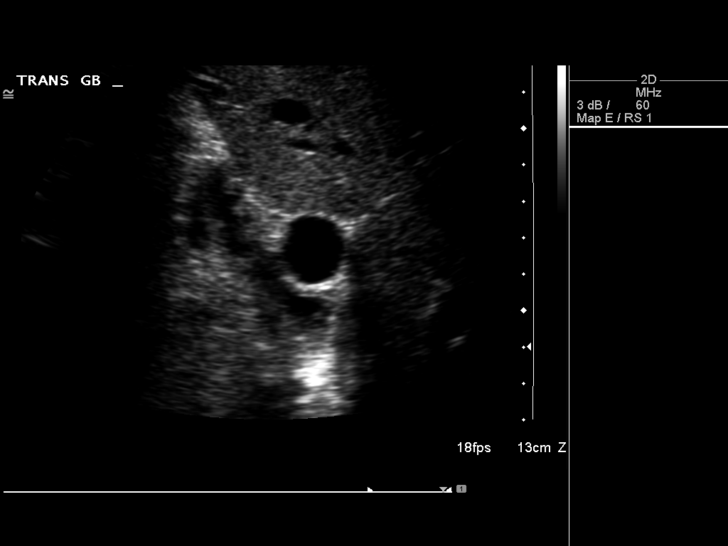
[im 46/122]
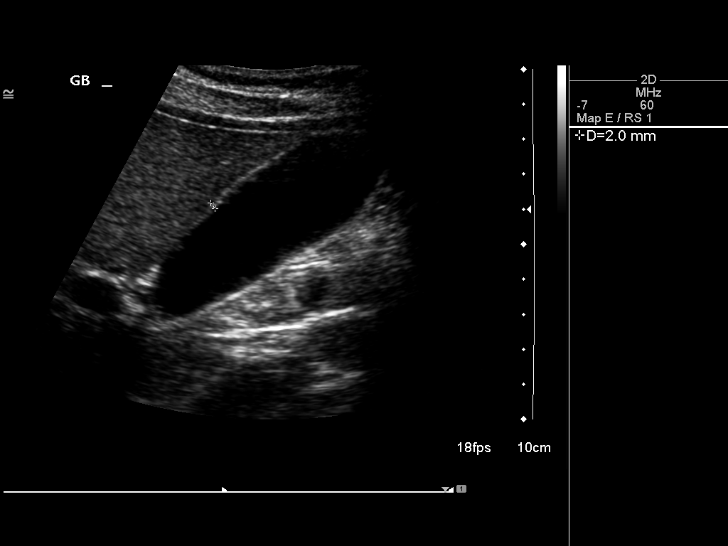
[im 56/122]
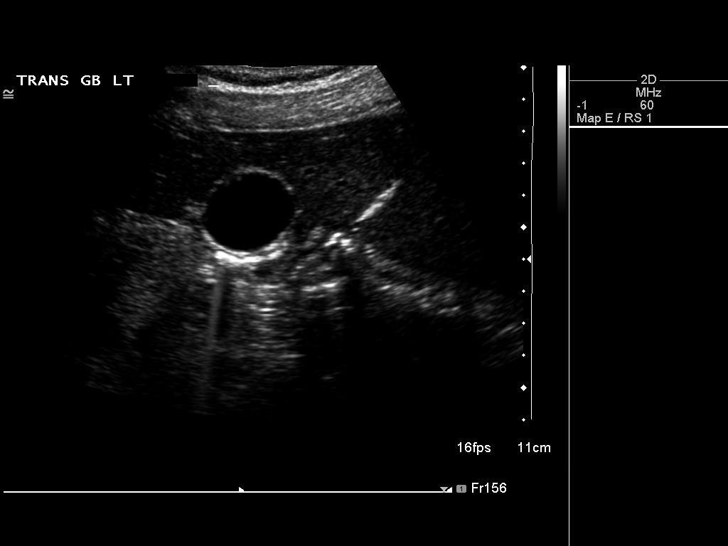
[im 66/122]
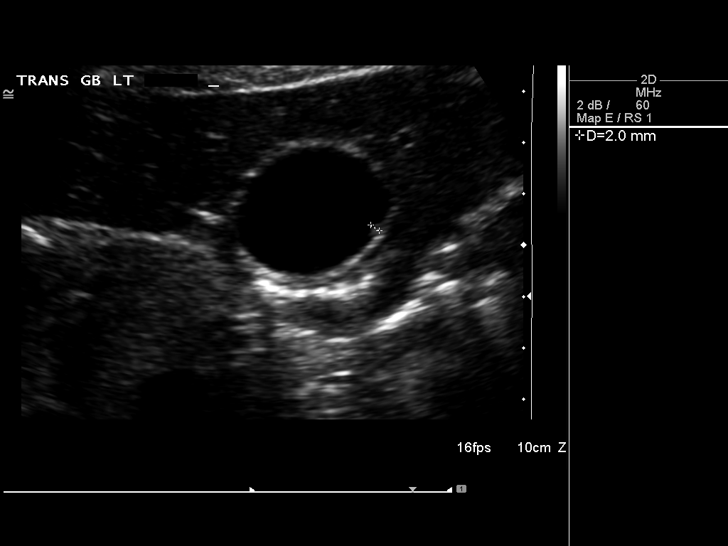
[im 76/122]
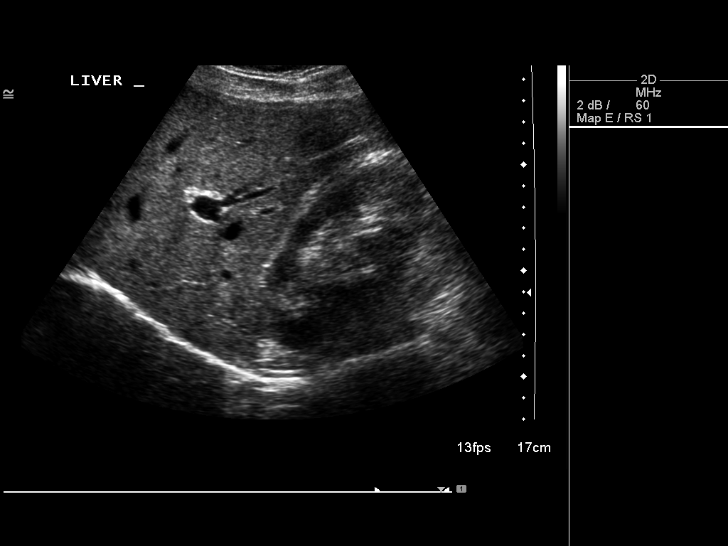
[im 81/122]
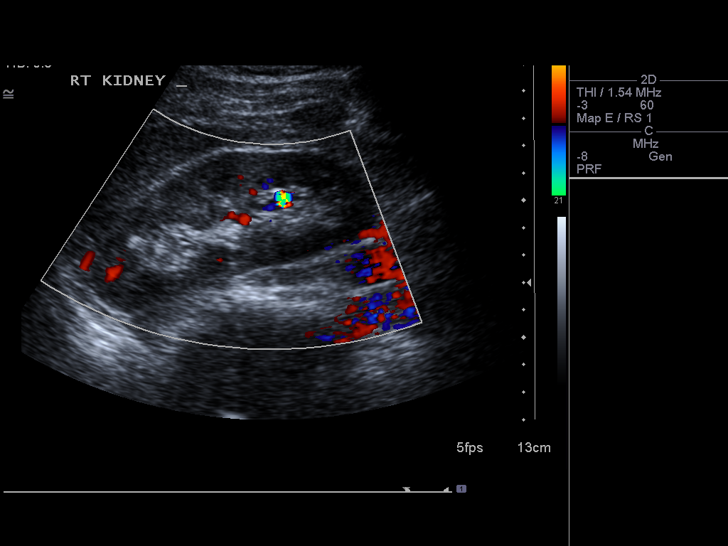
[im 91/122]
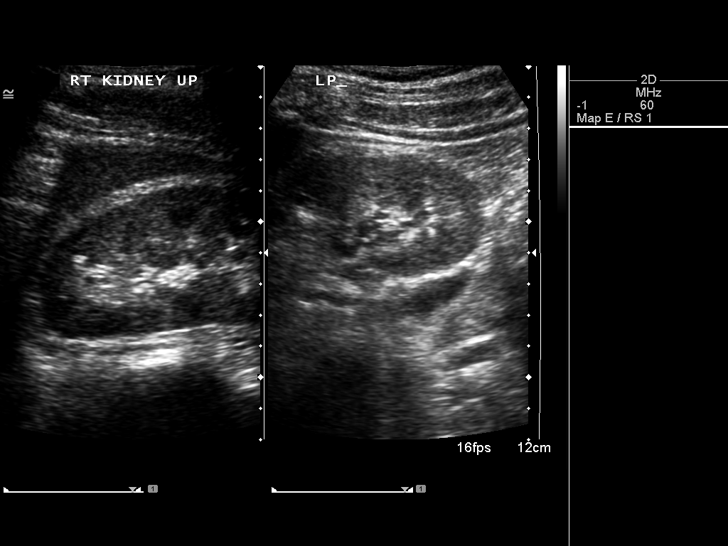
[im 101/122]
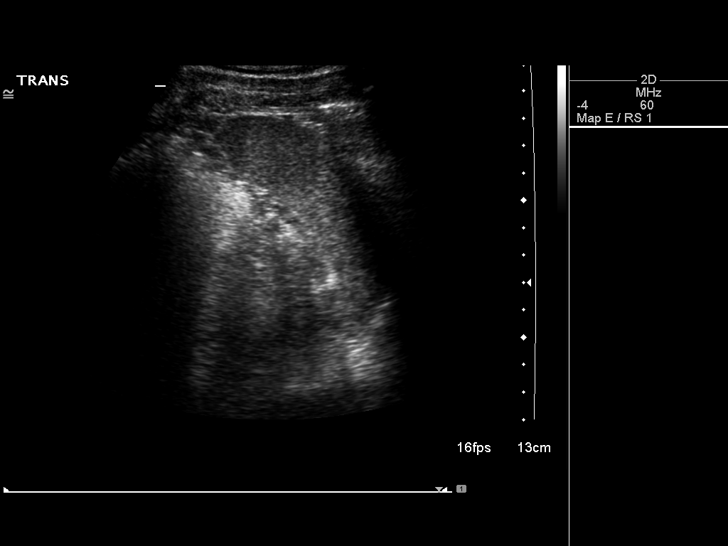
[im 111/122]
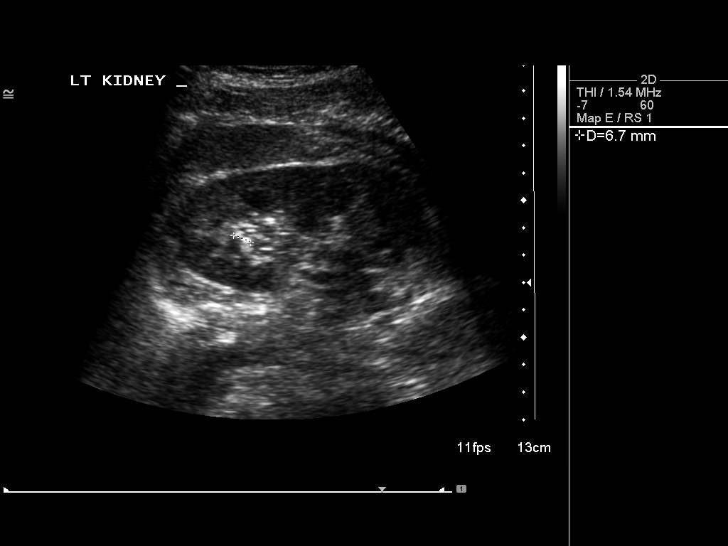
[im 122/122]
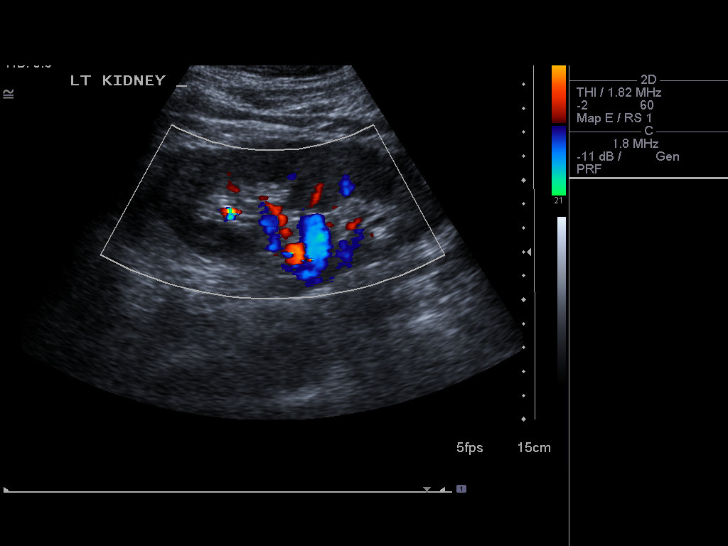

[14 of 25 positions shown; findings below may reference images not displayed]

FINDINGS: Gallbladder:  There are scattered non mobile echogenic lesions
along the gallbladder wall, measuring 4 mm or less in size.
Gallbladder wall measures 2 mm.  No gallstones.  No sonographic
Murphy's sign.

Common bile duct:  Measures 4 mm, within normal limits.

Liver:  May be slightly increased in echogenicity diffusely.  No
definite focal lesions.

IVC:  Appears normal.

Pancreas:  Head and tail are suboptimally visualized due to bowel
gas.  Otherwise negative.

Spleen:  Measures 6.7 cm, negative.

Right Kidney:  Measures 10.9 cm.  Parenchymal echogenicity is
normal.  There are 5 foci of increased echogenicity measuring 4 mm
or less in size.  No hydronephrosis.  The

Left Kidney:  Measures 12.2 cm.  Parenchymal echogenicity is
normal.  There are four foci of increased echogenicity measuring 7
mm or less in size.  No hydronephrosis.

Abdominal aorta:  No aneurysm identified.
IMPRESSION: 1.  Liver appears mildly fatty.
2.  Probable gallbladder polyps.
3.  Bilateral renal stones without obstruction.

## 2018-05-23 ENCOUNTER — Ambulatory Visit: Payer: Self-pay | Admitting: Family Medicine

## 2018-06-09 ENCOUNTER — Other Ambulatory Visit: Payer: Self-pay

## 2018-06-09 ENCOUNTER — Encounter: Payer: Self-pay | Admitting: Family Medicine

## 2018-06-09 ENCOUNTER — Ambulatory Visit (INDEPENDENT_AMBULATORY_CARE_PROVIDER_SITE_OTHER): Payer: Medicaid Other | Admitting: Family Medicine

## 2018-06-09 ENCOUNTER — Telehealth: Payer: Self-pay | Admitting: *Deleted

## 2018-06-09 VITALS — BP 102/68 | HR 97 | Temp 98.5°F | Ht 68.0 in | Wt 159.0 lb

## 2018-06-09 DIAGNOSIS — Z0000 Encounter for general adult medical examination without abnormal findings: Secondary | ICD-10-CM

## 2018-06-09 DIAGNOSIS — R21 Rash and other nonspecific skin eruption: Secondary | ICD-10-CM

## 2018-06-09 LAB — POCT GLYCOSYLATED HEMOGLOBIN (HGB A1C): HBA1C, POC (CONTROLLED DIABETIC RANGE): 5.1 % (ref 0.0–7.0)

## 2018-06-09 MED ORDER — HYDROCORTISONE 1 % EX LOTN: 1.0000 "application " | TOPICAL_LOTION | Freq: Two times a day (BID) | CUTANEOUS | 0 refills | Status: DC

## 2018-06-09 MED ORDER — HYDROCORTISONE 2.5 % EX CREA: TOPICAL_CREAM | Freq: Two times a day (BID) | CUTANEOUS | 0 refills | Status: DC

## 2018-06-09 MED ORDER — HYDROCORTISONE 2.5 % RE CREA: 1.0000 "application " | TOPICAL_CREAM | Freq: Two times a day (BID) | RECTAL | 0 refills | Status: DC

## 2018-06-09 NOTE — Patient Instructions (Signed)
It was great to meet you today! Thank you for letting me participate in your care!  Today, we discussed your overall healthcare. I will continue to research if the HPV vaccine will be covered by Medicaid.   I have sent you a hydrocortisone lotion for your rash. Please use it as prescribed.  I will be in contact with you about your lab results.  Be well, Robert Schickim [name], DO PGY-1, [facility]

## 2018-06-09 NOTE — Telephone Encounter (Signed)
-----   Message from Arlyce Harmanimothy Lockamy, DO sent at 06/09/2018  5:01 PM EDT ----- Regarding: Hydrocortisone cream New cream is ordered for patient covered by medicaid.

## 2018-06-09 NOTE — Telephone Encounter (Signed)
Pt informed of medication change to ensure insurance will cover. Fleeger, Maryjo RochesterJessica Dawn, CMA

## 2018-06-09 NOTE — Progress Notes (Signed)
Subjective: Chief Complaint  Patient presents with  . re-establish care   HPI: Robert Moon is a 47 y.o. presenting to clinic today to discuss the following:  New Patient/Establish Care [name] is a 46y/o male with a PMH of glaucoma and NASH who presents to re-establish care. He overall has been doing well and is seeing an opthamologist for management of his open angel glaucoma. Past medical history, current medications, and known allergies were all reviewed.   Rash Patient endorses a rash under his left and right arm pit. States it is sensitive to touch but otherwise is not painful. He states it is itchy, dry, and red. He has had a rash like this in the past and typically applies OTC lotion and switches deodorants and the rash resolves. However, this approach has not worked with this rash and it has been ongoing for the past 2 weeks.   No fever, chills, difficulty breathing, abdominal pain, nausea, vomiting, diarrhea, or constipation.  Health Maintenance: HIV screening and Tetanus    ROS noted in HPI.   Past Medical, Surgical, Social, and Family History Reviewed & Updated per EMR.   Pertinent Historical Findings include:   Social History   Tobacco Use  Smoking Status Never Smoker  Smokeless Tobacco Never Used    Objective: BP 102/68   Pulse 97   Temp 98.5 F (36.9 C) (Oral)   Ht 5\' 8"  (1.727 m)   Wt 159 lb (72.1 kg)   SpO2 98%   BMI 24.18 kg/m  Vitals and nursing notes reviewed  Physical Exam  Gen: Alert and Oriented x 3, NAD HEENT: Normocephalic, atraumatic, PERRLA, EOMI, TM visible with good light reflex, non-swollen, non-erythematous turbinates, non-erythematous pharyngeal mucosa, no exudates Neck: trachea midline, no thyroidmegaly, no LAD CV: RRR, no murmurs, normal S1, S2 split, +2 pulses dorsalis pedis bilaterally Resp: CTAB, no wheezing, rales, or rhonchi, comfortable work of breathing Abd: non-distended, non-tender, soft, +bs in all four  quadrants, no hepatosplenomegaly MSK: Moves all four extremities Ext: no clubbing, cyanosis, or edema Neuro: CN II-XII intact, no focal or gross deficits Skin: warm, dry, intact, rash present in left axilla (see picture below) Psych: appropriate behavior, mood      Results for orders placed or performed in visit on 06/09/18 (from the past 72 hour(s))  HgB A1c     Status: None   Collection Time: 06/09/18  4:10 PM  Result Value Ref Range   Hemoglobin A1C  4.0 - 5.6 %   HbA1c, POC (prediabetic range)  5.7 - 6.4 %   HbA1c, POC (controlled diabetic range) 5.1 0.0 - 7.0 %  Lipid Panel     Status: None   Collection Time: 06/09/18  4:19 PM  Result Value Ref Range   Cholesterol, Total 144 100 - 199 mg/dL   Triglycerides 51 0 - 149 mg/dL   HDL 55 >16>39 mg/dL   VLDL Cholesterol Cal 10 5 - 40 mg/dL   LDL Calculated 79 0 - 99 mg/dL   Chol/HDL Ratio 2.6 0.0 - 5.0 ratio    Comment:                                   T. Chol/HDL Ratio  Men  Women                               1/2 Avg.Risk  3.4    3.3                                   Avg.Risk  5.0    4.4                                2X Avg.Risk  9.6    7.1                                3X Avg.Risk 23.4   11.0     Assessment/Plan:  Health maintenance examination Obtain HgA1c and lipid panel for routine screening as he has not had any of these in the past few years.  CBC and CMP were performed at [facility] within the past 3 months and were wnl and I personally reviewed them.  Patient would like HPV vaccine and Tetanus vaccine but wants both two weeks apart. Will research to see if HPV will be covered.  Rash and nonspecific skin eruption Likely contact dermatitis from deodorant. Not responding to change in deodorant or OTC lotion/creams.  Prescribed hydrocortisone 2.5% cream to help with inflammation and itching. Follow up in two weeks, return sooner if no improvement.   PATIENT EDUCATION  PROVIDED: See AVS    Diagnosis and plan along with any newly prescribed medication(s) were discussed in detail with this patient today. The patient verbalized understanding and agreed with the plan. Patient advised if symptoms worsen return to clinic or ER.   Health Maintainance:   Orders Placed This Encounter  Procedures  . Lipid Panel  . HgB A1c    Meds ordered this encounter  Medications  . DISCONTD: hydrocortisone 1 % lotion    Sig: Apply 1 application topically 2 (two) times daily.    Dispense:  118 mL    Refill:  0  . DISCONTD: hydrocortisone (ANUSOL-HC) 2.5 % rectal cream    Sig: Place 1 application rectally 2 (two) times daily.    Dispense:  30 g    Refill:  0  . hydrocortisone 2.5 % cream    Sig: Apply topically 2 (two) times daily.    Dispense:  30 g    Refill:  0   Jules Schick, DO 06/11/2018, 11:29 PM PGY-1, [facility]

## 2018-06-10 LAB — LIPID PANEL
CHOL/HDL RATIO: 2.6 ratio (ref 0.0–5.0)
Cholesterol, Total: 144 mg/dL (ref 100–199)
HDL: 55 mg/dL (ref >39)
LDL CALC: 79 mg/dL (ref 0–99)
Triglycerides: 51 mg/dL (ref 0–149)
VLDL Cholesterol Cal: 10 mg/dL (ref 5–40)

## 2018-06-11 ENCOUNTER — Encounter: Payer: Self-pay | Admitting: Family Medicine

## 2018-06-11 DIAGNOSIS — R21 Rash and other nonspecific skin eruption: Secondary | ICD-10-CM | POA: Insufficient documentation

## 2018-06-11 NOTE — Assessment & Plan Note (Signed)
Obtain HgA1c and lipid panel for routine screening as he has not had any of these in the past few years.  [facility] the past 3 months and were wnl and I personally reviewed them.  Patient would like HPV vaccine and Tetanus vaccine but wants both two weeks apart. Will research to see if HPV will be covered.

## 2018-06-11 NOTE — Assessment & Plan Note (Signed)
Likely contact dermatitis from deodorant. Not responding to change in deodorant or OTC lotion/creams.  Prescribed hydrocortisone 2.5% cream to help with inflammation and itching. Follow up in two weeks, return sooner if no improvement.

## 2018-06-23 ENCOUNTER — Encounter: Payer: Self-pay | Admitting: Family Medicine

## 2018-06-23 NOTE — Progress Notes (Signed)
Mailing letter of normal results to patient

## 2018-08-20 ENCOUNTER — Encounter: Payer: Self-pay | Admitting: Family Medicine

## 2018-10-28 ENCOUNTER — Encounter

## 2018-10-28 ENCOUNTER — Ambulatory Visit (INDEPENDENT_AMBULATORY_CARE_PROVIDER_SITE_OTHER): Payer: Medicaid Other | Admitting: Cardiovascular Disease

## 2018-10-28 ENCOUNTER — Encounter: Payer: Self-pay | Admitting: Cardiovascular Disease

## 2018-10-28 VITALS — BP 113/73 | HR 78 | Ht 68.5 in | Wt 169.8 lb

## 2018-10-28 DIAGNOSIS — I493 Ventricular premature depolarization: Secondary | ICD-10-CM | POA: Diagnosis not present

## 2018-10-28 NOTE — Progress Notes (Signed)
Cardiology Office Note   Date:  10/28/2018   ID:  Robert Moon, DOB 18-Oct-1971, MRN [id number]  PCP:  Arlyce Harman, DO  Cardiologist:   Lorine Bears, MD   Chief Complaint  Patient presents with  . OTHER    PVC c/o orthostatic HR ls 2013. Meds reviewed verbally with pt.      History of Present Illness: Robert Moon is a 47 y.o. male who is self-referred for evaluation of PVCs.  He was seen by me in 2013 for cardiac evaluation before starting an exercise program.  He underwent a treadmill stress test which showed no evidence of ischemia.  He was able to exercise for 12 minutes.  He participated in clinical research and volunteers.  During some of these routine visits, he was noted to have some PVCs and thus he is here for evaluation.  He denies any palpitations.  No chest pain or shortness of breath.  His other complaint is that his resting heart rate is occasionally above 100 when standing.  Occasionally his heart rate increases by more than 30 bpm from lying to standing position but that not associated with any symptoms.    Past Medical History:  Diagnosis Date  . Fatty infiltration of liver 2007   No obvious etiology  . PVC (premature ventricular contraction)   . Renal stone 2008    Past Surgical History:  Procedure Laterality Date  . COLONOSCOPY  2013  . HERNIA REPAIR  2004   inguinal     Current Outpatient Medications  Medication Sig Dispense Refill  . Adapalene 0.3 % gel Apply topically at bedtime.    Marland Kitchen latanoprost (XALATAN) 0.005 % ophthalmic solution Place 1 drop into the right eye at bedtime.     No current facility-administered medications for this visit.     Allergies:   Timothy grass pollen allergen; Darunavir; Etravirine; Ritonavir; and Sulfa antibiotics    Social History:  The patient  reports that he has never smoked. He has never used smokeless tobacco. He reports that he drinks alcohol. He reports that he does not use drugs.   Family  History:  The patient's family history includes ALS (age of onset: 69) in his other; Adrenal disorder (age of onset: 5) in his sister; Alcohol abuse in his paternal grandfather; Allergies in his maternal grandmother; Atrial fibrillation (age of onset: 3) in his maternal grandmother; Depression in his mother; Diabetes type II in his paternal grandfather and paternal grandmother; Heart failure (age of onset: 46) in his maternal grandfather; Heart failure (age of onset: 51) in his paternal grandfather; Hypertension in his maternal grandmother and paternal grandmother; Kidney failure in his paternal grandmother; Liver cancer (age of onset: 48) in his mother; Prostate cancer (age of onset: 2) in his paternal grandfather; Stroke (age of onset: 22) in his maternal grandmother.     ROS:  Please see the history of present illness.   Otherwise, review of systems are positive for none.   All other systems are reviewed and negative.    PHYSICAL EXAM: VS:  BP 113/73 (BP Location: Right Arm, Patient Position: Sitting, Cuff Size: Normal)   Pulse 78   Ht 5' 8.5" (1.74 m)   Wt 169 lb 12 oz (77 kg)   BMI 25.43 kg/m  , BMI Body mass index is 25.43 kg/m. GEN: Well nourished, well developed, in no acute distress  HEENT: normal  Neck: no JVD, carotid bruits, or masses Cardiac: RRR; no murmurs, rubs, or gallops,no edema  Respiratory:  clear to auscultation bilaterally, normal work of breathing GI: soft, nontender, nondistended, + BS MS: no deformity or atrophy  Skin: warm and dry, no rash Neuro:  Strength and sensation are intact Psych: euthymic mood, full affect   EKG:  EKG is ordered today. The ekg ordered today demonstrates normal sinus rhythm with no significant ST or T wave changes.   Recent Labs: No results found for requested labs within last 8760 hours.    Lipid Panel    Component Value Date/Time   CHOL 144 06/09/2018 1619   TRIG 51 06/09/2018 1619   HDL 55 06/09/2018 1619   CHOLHDL 2.6  06/09/2018 1619   LDLCALC 79 06/09/2018 1619      Wt Readings from Last 3 Encounters:  10/28/18 169 lb 12 oz (77 kg)  06/09/18 159 lb (72.1 kg)  04/07/13 144 lb 12.8 oz (65.7 kg)       PAD Screen 10/28/2018  Previous PAD dx? No  Previous surgical procedure? No  Pain with walking? No  Feet/toe relief with dangling? No  Painful, non-healing ulcers? No  Extremities discolored? No      ASSESSMENT AND PLAN:  1.  PVCs: These seem to be very mild overall.  No evidence of PVCs today and no evidence of PVCs by physical exam.  His cardiac exam is normal and baseline EKG does not show any significant abnormalities.  I reassured him that this does not indicate any significant cardiac abnormality.  No need for further work-up given the lack of symptoms and low burden.  2.  Tachycardia: Sometimes with standing up but this did not happen today when we examined his orthostatic vital signs.  Again he is asymptomatic and does not require any further cardiac work-up.    Disposition:   FU with me as needed  Signed,  Lorine Bears, MD  10/28/2018 3:20 PM    [facility]

## 2018-10-28 NOTE — Patient Instructions (Signed)
Medication Instructions:  No changes If you need a refill on your cardiac medications before your next appointment, please call your pharmacy.   Lab work: None ordered  Testing/Procedures: None ordered  Follow-Up: As needed with Dr. Arida  

## 2019-02-05 ENCOUNTER — Encounter: Payer: Self-pay | Admitting: Family Medicine

## 2019-02-05 ENCOUNTER — Other Ambulatory Visit: Payer: Self-pay | Admitting: Family Medicine

## 2019-02-05 DIAGNOSIS — R21 Rash and other nonspecific skin eruption: Secondary | ICD-10-CM

## 2019-02-05 NOTE — Progress Notes (Signed)
Patient request referral via MyChart as he has appointment but needs referral for insurance coverage purposes. Agree with referral.

## 2019-06-05 ENCOUNTER — Encounter: Payer: Self-pay | Admitting: Family Medicine

## 2019-07-01 ENCOUNTER — Other Ambulatory Visit: Payer: Self-pay

## 2019-07-01 ENCOUNTER — Ambulatory Visit (INDEPENDENT_AMBULATORY_CARE_PROVIDER_SITE_OTHER): Payer: Medicaid Other | Admitting: Family Medicine

## 2019-07-01 VITALS — BP 122/80 | HR 79

## 2019-07-01 DIAGNOSIS — R740 Nonspecific elevation of levels of transaminase and lactic acid dehydrogenase [LDH]: Secondary | ICD-10-CM

## 2019-07-01 DIAGNOSIS — M79672 Pain in left foot: Secondary | ICD-10-CM | POA: Diagnosis not present

## 2019-07-01 DIAGNOSIS — Z0000 Encounter for general adult medical examination without abnormal findings: Secondary | ICD-10-CM

## 2019-07-01 DIAGNOSIS — Z23 Encounter for immunization: Secondary | ICD-10-CM | POA: Diagnosis not present

## 2019-07-01 DIAGNOSIS — R7401 Elevation of levels of liver transaminase levels: Secondary | ICD-10-CM

## 2019-07-01 NOTE — Progress Notes (Signed)
Subjective: Chief Complaint  Patient presents with  . Foot Pain    left     HPI: Robert Moon is a 48 y.o. presenting to clinic today to discuss the following:  Left Heel Pain Patient states he began developing left heel pain about 1 and half months ago. It occurs after he has been walking or if he has been standing for long periods of time. He has tried sitting which relieves the pain temporarily but it comes back. He did try switching shoes which also helped some but not a lot. Due to the pain he has noticed that he does at times "limp" because it is tender to walk on but reports on loss of balance or strength. He first noticed it while sitting down and denies any acute injury. The pain is described as feeling like "a deep bruise" and has improved over time. He reports no radiating pain, no numbness, no tingling.  No fevers, weight loss, night sweats, abdominal pain, nausea, vomiting, diarrhea, or constipation.    Elevated Transaminases Patient has a history of elevated "liver labs" and desired we recheck it today. He states he got these labs drawn at another facility and are not in our system. No current complaints such as abdominal pain, nausea, or vomiting.   Healthcare Maintenance HIV one time check and Dtap today   ROS noted in HPI.   Past Medical, Surgical, Social, and Family History Reviewed & Updated per EMR.   Pertinent Historical Findings include:   Social History   Tobacco Use  Smoking Status Never Smoker  Smokeless Tobacco Never Used    Objective: BP 122/80   Pulse 79   SpO2 96%  Vitals and nursing notes reviewed  Physical Exam Gen: Alert and Oriented x 3, NAD HEENT: Normocephalic, atraumatic CV: RRR, no murmurs, normal S1, S2 split Resp: CTAB, no wheezing, rales, or rhonchi, comfortable work of breathing Abd: non-distended, non-tender, soft, +bs in all four quadrants MSK: Left Heel Pain: TTP at the insertion of the achilles tendon, no obvious  bony deformity or ecchymosis, strength 5/5, gross sensation intact, full range of motion, no tenderness at the base of the 5th MTP, no tenderness at the medial or lateral malleolus, normal gait, negative drawer test, negative Thompson test Ext: no clubbing, cyanosis, or edema Skin: warm, dry, intact, no rashes   Results for orders placed or performed in visit on 07/01/19 (from the past 72 hour(s))  HIV antibody (with reflex)     Status: None   Collection Time: 07/01/19  9:36 AM  Result Value Ref Range   HIV Screen 4th Generation wRfx Non Reactive Non Reactive  Comprehensive metabolic panel     Status: None   Collection Time: 07/01/19  9:36 AM  Result Value Ref Range   Glucose 97 65 - 99 mg/dL   BUN 11 6 - 24 mg/dL   Creatinine, Ser 1.611.14 0.76 - 1.27 mg/dL   GFR calc non Af Amer 77 >59 mL/min/1.73   GFR calc Af Amer 89 >59 mL/min/1.73   BUN/Creatinine Ratio 10 9 - 20   Sodium 141 134 - 144 mmol/L   Potassium 4.4 3.5 - 5.2 mmol/L   Chloride 104 96 - 106 mmol/L   CO2 24 20 - 29 mmol/L   Calcium 9.2 8.7 - 10.2 mg/dL   Total Protein 7.3 6.0 - 8.5 g/dL   Albumin 4.6 4.0 - 5.0 g/dL   Globulin, Total 2.7 1.5 - 4.5 g/dL   Albumin/Globulin Ratio  1.7 1.2 - 2.2   Bilirubin Total 0.4 0.0 - 1.2 mg/dL   Alkaline Phosphatase 64 39 - 117 IU/L   AST 31 0 - 40 IU/L   ALT 38 0 - 44 IU/L    Assessment/Plan:  Pain of left heel Differential includes Achilles tendonopathy vs achilles tendon rupture vs retrocalcaneal bursitis. Achilles tendon rupture is unlikely as he had no major inciting event, no bruising or swelling, and never had any inability to walk. Physical exam supports this as he has a negative Thompson test. Considered retrocalcaneal bursitis but his tenderness was located more at the insertion point of the achilles tendon and not behind the actual tendon. - Conservative measures such as rest, ice, heat as needed - Ibuprofen 400mg  q8hrs as needed for pain and before long periods or walking  or standing - Given home exercise stretching program consisting of eccentric exercises - F/u as needed in 4-6 weeks if not improved   PATIENT EDUCATION PROVIDED: See AVS    Diagnosis and plan along with any newly prescribed medication(s) were discussed in detail with this patient today. The patient verbalized understanding and agreed with the plan. Patient advised if symptoms worsen return to clinic or ER.   Health Maintainance:  Orders Placed This Encounter  Procedures  . Tdap vaccine greater than or equal to 7yo IM  . HIV antibody (with reflex)  . Comprehensive metabolic panel    Order Specific Question:   Has the patient fasted?    Answer:   No    No orders of the defined types were placed in this encounter.    Harolyn Rutherford, DO 07/01/2019, 8:37 AM PGY-3 [place]

## 2019-07-01 NOTE — Patient Instructions (Addendum)
It was great to see you today! Thank you for letting me participate in your care!  Today, we discussed your left heel pain that is a result of achilles tendinitis. I recommend relative rest, use of 400mg  of Ibuprofen BEFORE you will be on your feet or walking much, and ice/heat as needed. I have also included some exercises for you to do. Remember, start low and go slow but this will resolve with time. If it does not please return to the clinic.       Be well, Harolyn Rutherford, DO PGY-3, [facility]]   Achilles Tendinitis  Achilles tendinitis is inflammation of the tough, cord-like band that attaches the lower leg muscles to the heel bone (Achilles tendon). This is usually caused by overusing the tendon and the ankle joint. Achilles tendinitis usually gets better over time with treatment and caring for yourself at home. It can take weeks or months to heal completely. What are the causes? This condition may be caused by:  A sudden increase in exercise or activity, such as running.  Doing the same exercises or activities (such as jumping) over and over.  Not warming up calf muscles before exercising.  Exercising in shoes that are worn out or not made for exercise.  Having arthritis or a bone growth (spur) on the back of the heel bone. This can rub against the tendon and hurt it.  Age-related wear and tear. Tendons become less flexible with age and more likely to be injured. What are the signs or symptoms? Common symptoms of this condition include:  Pain in the Achilles tendon or in the back of the leg, just above the heel. The pain usually gets worse with exercise.  Stiffness or soreness in the back of the leg, especially in the morning.  Swelling of the skin over the Achilles tendon.  Thickening of the tendon.  Bone spurs at the bottom of the Achilles tendon, near the heel.  Trouble standing on tiptoe. How is this diagnosed? This condition is diagnosed based on  your symptoms and a physical exam. You may have tests, including:  X-rays.  MRI. How is this treated? The goal of treatment is to relieve symptoms and help your injury heal. Treatment may include:  Decreasing or stopping activities that caused the tendinitis. This may mean switching to low-impact exercises like biking or swimming.  Icing the injured area.  Doing physical therapy, including strengthening and stretching exercises.  NSAIDs to help relieve pain and swelling.  Using supportive shoes, wraps, heel lifts, or a walking boot (air cast).  Surgery. This may be done if your symptoms do not improve after 6 months.  Using high-energy shock wave impulses to stimulate the healing process (extracorporeal shock wave therapy). This is rare.  Injection of medicines to help relieve inflammation (corticosteroids). This is rare. Follow these instructions at home: If you have an air cast:  Wear the cast as told by your health care provider. Remove it only as told by your health care provider.  Loosen the cast if your toes tingle, become numb, or turn cold and blue. Activity  Gradually return to your normal activities once your health care provider approves. Do not do activities that cause pain. ? Consider doing low-impact exercises, like cycling or swimming.  If you have an air cast, ask your health care provider when it is safe for you to drive.  If physical therapy was prescribed, do exercises as told by your health care provider or  physical therapist. Managing pain, stiffness, and swelling   Raise (elevate) your foot above the level of your heart while you are sitting or lying down.  Move your toes often to avoid stiffness and to lessen swelling.  If directed, put ice on the injured area: ? Put ice in a plastic bag. ? Place a towel between your skin and the bag. ? Leave the ice on for 20 minutes, 2-3 times a day General instructions  If directed, wrap your foot with an  elastic bandage or other wrap. This can help keep your tendon from moving too much while it heals. Your health care provider will show you how to wrap your foot correctly.  Wear supportive shoes or heel lifts only as told by your health care provider.  Take over-the-counter and prescription medicines only as told by your health care provider.  Keep all follow-up visits as told by your health care provider. This is important. Contact a health care provider if:  You have symptoms that gets worse.  You have pain that does not get better with medicine.  You develop new, unexplained symptoms.  You develop warmth and swelling in your foot.  You have a fever. Get help right away if:  You have a sudden popping sound or sensation in your Achilles tendon followed by severe pain.  You cannot move your toes or foot.  You cannot put any weight on your foot. Summary  Achilles tendinitis is inflammation of the tough, cord-like band that attaches the lower leg muscles to the heel bone (Achilles tendon).  This condition is usually caused by overusing the tendon and the ankle joint. It can also be caused by arthritis or normal aging.  The most common symptoms of this condition include pain, swelling, or stiffness in the Achilles tendon or in the back of the leg.  This condition is usually treated with rest, NSAIDs, and physical therapy. This information is not intended to replace advice given to you by your health care provider. Make sure you discuss any questions you have with your health care provider. Document Released: 09/19/2005 Document Revised: 11/22/2017 Document Reviewed: 10/29/2016 Elsevier Patient Education  2020 [employer]

## 2019-07-02 LAB — COMPREHENSIVE METABOLIC PANEL
ALT: 38 IU/L (ref 0–44)
AST: 31 IU/L (ref 0–40)
Albumin/Globulin Ratio: 1.7 (ref 1.2–2.2)
Albumin: 4.6 g/dL (ref 4.0–5.0)
Alkaline Phosphatase: 64 IU/L (ref 39–117)
BUN/Creatinine Ratio: 10 (ref 9–20)
BUN: 11 mg/dL (ref 6–24)
Bilirubin Total: 0.4 mg/dL (ref 0.0–1.2)
CO2: 24 mmol/L (ref 20–29)
Calcium: 9.2 mg/dL (ref 8.7–10.2)
Chloride: 104 mmol/L (ref 96–106)
Creatinine, Ser: 1.14 mg/dL (ref 0.76–1.27)
GFR calc Af Amer: 89 mL/min/{1.73_m2} (ref >59)
GFR calc non Af Amer: 77 mL/min/{1.73_m2} (ref >59)
Globulin, Total: 2.7 g/dL (ref 1.5–4.5)
Glucose: 97 mg/dL (ref 65–99)
Potassium: 4.4 mmol/L (ref 3.5–5.2)
Sodium: 141 mmol/L (ref 134–144)
Total Protein: 7.3 g/dL (ref 6.0–8.5)

## 2019-07-02 LAB — HIV ANTIBODY (ROUTINE TESTING W REFLEX): HIV Screen 4th Generation wRfx: NONREACTIVE

## 2019-07-03 NOTE — Assessment & Plan Note (Signed)
Differential includes Achilles tendonopathy vs achilles tendon rupture vs retrocalcaneal bursitis. Achilles tendon rupture is unlikely as he had no major inciting event, no bruising or swelling, and never had any inability to walk. Physical exam supports this as he has a negative Thompson test. Considered retrocalcaneal bursitis but his tenderness was located more at the insertion point of the achilles tendon and not behind the actual tendon. - Conservative measures such as rest, ice, heat as needed - Ibuprofen 400mg  q8hrs as needed for pain and before long periods or walking or standing - Given home exercise stretching program consisting of eccentric exercises - F/u as needed in 4-6 weeks if not improved

## 2021-05-17 ENCOUNTER — Encounter: Payer: Self-pay | Admitting: Family Medicine

## 2021-08-13 ENCOUNTER — Encounter: Payer: Self-pay | Admitting: Family Medicine

## 2021-10-04 ENCOUNTER — Ambulatory Visit (LOCAL_COMMUNITY_HEALTH_CENTER): Payer: Commercial Managed Care - PPO

## 2021-10-04 ENCOUNTER — Other Ambulatory Visit: Payer: Self-pay

## 2021-10-04 DIAGNOSIS — Z23 Encounter for immunization: Secondary | ICD-10-CM

## 2021-10-04 NOTE — Progress Notes (Signed)
In Nurse Clinic for [facility] vaccine. States sensitivity to eggs and always gets flublok vaccine. Flublok given and tolerated well. Updated [facility] copy given and explained. Jerel Shepherd, RN

## 2022-05-29 ENCOUNTER — Encounter: Payer: Self-pay | Admitting: *Deleted
